# Patient Record
Sex: Female | Born: 1980 | Race: Black or African American | Hispanic: No | Smoking: Never smoker
Health system: Southern US, Community
[De-identification: ages and names within clinical notes are randomized; demographics above are authoritative.]

## PROBLEM LIST (undated history)

## (undated) DIAGNOSIS — Z872 Personal history of diseases of the skin and subcutaneous tissue: Secondary | ICD-10-CM

## (undated) DIAGNOSIS — N940 Mittelschmerz: Secondary | ICD-10-CM

## (undated) DIAGNOSIS — B977 Papillomavirus as the cause of diseases classified elsewhere: Secondary | ICD-10-CM

## (undated) DIAGNOSIS — K141 Geographic tongue: Secondary | ICD-10-CM

## (undated) DIAGNOSIS — D509 Iron deficiency anemia, unspecified: Secondary | ICD-10-CM

## (undated) DIAGNOSIS — J019 Acute sinusitis, unspecified: Secondary | ICD-10-CM

## (undated) DIAGNOSIS — M79609 Pain in unspecified limb: Secondary | ICD-10-CM

## (undated) DIAGNOSIS — L259 Unspecified contact dermatitis, unspecified cause: Secondary | ICD-10-CM

## (undated) HISTORY — DX: Acute sinusitis, unspecified: J01.90

## (undated) HISTORY — DX: Personal history of diseases of the skin and subcutaneous tissue: Z87.2

## (undated) HISTORY — DX: Unspecified contact dermatitis, unspecified cause: L25.9

## (undated) HISTORY — DX: Pain in unspecified limb: M79.609

## (undated) HISTORY — PX: COLPOSCOPY: SHX161

## (undated) HISTORY — DX: Mittelschmerz: N94.0

## (undated) HISTORY — DX: Iron deficiency anemia, unspecified: D50.9

## (undated) HISTORY — DX: Papillomavirus as the cause of diseases classified elsewhere: B97.7

## (undated) HISTORY — DX: Geographic tongue: K14.1

---
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
## 2001-06-04 ENCOUNTER — Ambulatory Visit (HOSPITAL_COMMUNITY): Admission: AD | Admit: 2001-06-04 | Discharge: 2001-06-04 | Payer: Self-pay | Admitting: *Deleted

## 2001-06-04 ENCOUNTER — Encounter: Payer: Self-pay | Admitting: *Deleted

## 2001-06-04 ENCOUNTER — Encounter (INDEPENDENT_AMBULATORY_CARE_PROVIDER_SITE_OTHER): Payer: Self-pay | Admitting: Specialist

## 2005-03-22 ENCOUNTER — Emergency Department (HOSPITAL_COMMUNITY): Admission: EM | Admit: 2005-03-22 | Discharge: 2005-03-22 | Payer: Self-pay | Admitting: Family Medicine

## 2005-08-17 ENCOUNTER — Emergency Department: Payer: Self-pay | Admitting: Emergency Medicine

## 2006-08-05 ENCOUNTER — Encounter: Payer: Self-pay | Admitting: Family Medicine

## 2006-08-05 LAB — CONVERTED CEMR LAB: Pap Smear: NORMAL

## 2007-01-11 ENCOUNTER — Ambulatory Visit: Payer: Self-pay | Admitting: Family Medicine

## 2007-01-31 ENCOUNTER — Emergency Department (HOSPITAL_COMMUNITY): Admission: EM | Admit: 2007-01-31 | Discharge: 2007-01-31 | Payer: Self-pay | Admitting: Family Medicine

## 2007-03-07 ENCOUNTER — Encounter (INDEPENDENT_AMBULATORY_CARE_PROVIDER_SITE_OTHER): Payer: Self-pay | Admitting: *Deleted

## 2007-03-11 ENCOUNTER — Ambulatory Visit: Payer: Self-pay | Admitting: Internal Medicine

## 2007-04-06 ENCOUNTER — Ambulatory Visit: Payer: Self-pay | Admitting: *Deleted

## 2007-04-06 ENCOUNTER — Other Ambulatory Visit: Admission: RE | Admit: 2007-04-06 | Discharge: 2007-04-06 | Payer: Self-pay | Admitting: Family Medicine

## 2007-04-06 ENCOUNTER — Encounter: Payer: Self-pay | Admitting: Family Medicine

## 2007-04-06 ENCOUNTER — Encounter: Payer: Self-pay | Admitting: Internal Medicine

## 2007-04-06 DIAGNOSIS — N940 Mittelschmerz: Secondary | ICD-10-CM

## 2007-04-11 LAB — CONVERTED CEMR LAB
Albumin: 3.5 g/dL (ref 3.5–5.2)
BUN: 8 mg/dL (ref 6–23)
Basophils Absolute: 0.2 10*3/uL — ABNORMAL HIGH (ref 0.0–0.1)
Basophils Relative: 3.3 % — ABNORMAL HIGH (ref 0.0–1.0)
CO2: 27 meq/L (ref 19–32)
Calcium: 9.1 mg/dL (ref 8.4–10.5)
Chloride: 106 meq/L (ref 96–112)
Creatinine, Ser: 0.8 mg/dL (ref 0.4–1.2)
Eosinophils Absolute: 0.1 10*3/uL (ref 0.0–0.6)
Eosinophils Relative: 0.9 % (ref 0.0–5.0)
GFR calc Af Amer: 112 mL/min
GFR calc non Af Amer: 92 mL/min
Glucose, Bld: 90 mg/dL (ref 70–99)
HCT: 33.7 % — ABNORMAL LOW (ref 36.0–46.0)
Hemoglobin: 11.2 g/dL — ABNORMAL LOW (ref 12.0–15.0)
Lymphocytes Relative: 39 % (ref 12.0–46.0)
MCHC: 33.3 g/dL (ref 30.0–36.0)
MCV: 78.3 fL (ref 78.0–100.0)
Monocytes Absolute: 0.2 10*3/uL (ref 0.2–0.7)
Monocytes Relative: 2.8 % — ABNORMAL LOW (ref 3.0–11.0)
Neutro Abs: 3 10*3/uL (ref 1.4–7.7)
Neutrophils Relative %: 54 % (ref 43.0–77.0)
Phosphorus: 4.4 mg/dL (ref 2.3–4.6)
Platelets: 256 10*3/uL (ref 150–400)
Potassium: 4.4 meq/L (ref 3.5–5.1)
RBC: 4.3 M/uL (ref 3.87–5.11)
RDW: 13.5 % (ref 11.5–14.6)
Sodium: 139 meq/L (ref 135–145)
WBC: 5.7 10*3/uL (ref 4.5–10.5)

## 2007-08-19 ENCOUNTER — Ambulatory Visit: Payer: Self-pay | Admitting: Internal Medicine

## 2007-09-12 ENCOUNTER — Encounter: Payer: Self-pay | Admitting: Family Medicine

## 2007-09-13 ENCOUNTER — Ambulatory Visit: Payer: Self-pay | Admitting: Family Medicine

## 2007-09-13 DIAGNOSIS — D509 Iron deficiency anemia, unspecified: Secondary | ICD-10-CM | POA: Insufficient documentation

## 2007-09-13 LAB — CONVERTED CEMR LAB
Ferritin: 11 ng/mL (ref 10.0–291.0)
Iron: 39 ug/dL — ABNORMAL LOW (ref 42–145)
Transferrin: 307 mg/dL (ref >=212.0)

## 2007-10-11 ENCOUNTER — Encounter: Payer: Self-pay | Admitting: Family Medicine

## 2007-10-15 IMAGING — CR DG ABDOMEN 3V
1 series · 4 of 4 positions shown · non-contrast
Comparison: none

REASON FOR EXAM: Abdominal pain, rm 15
COMMENTS:

[Series 1: view not recorded · 0.17mm/px · 4 of 4 slices shown]
[im 1/4]
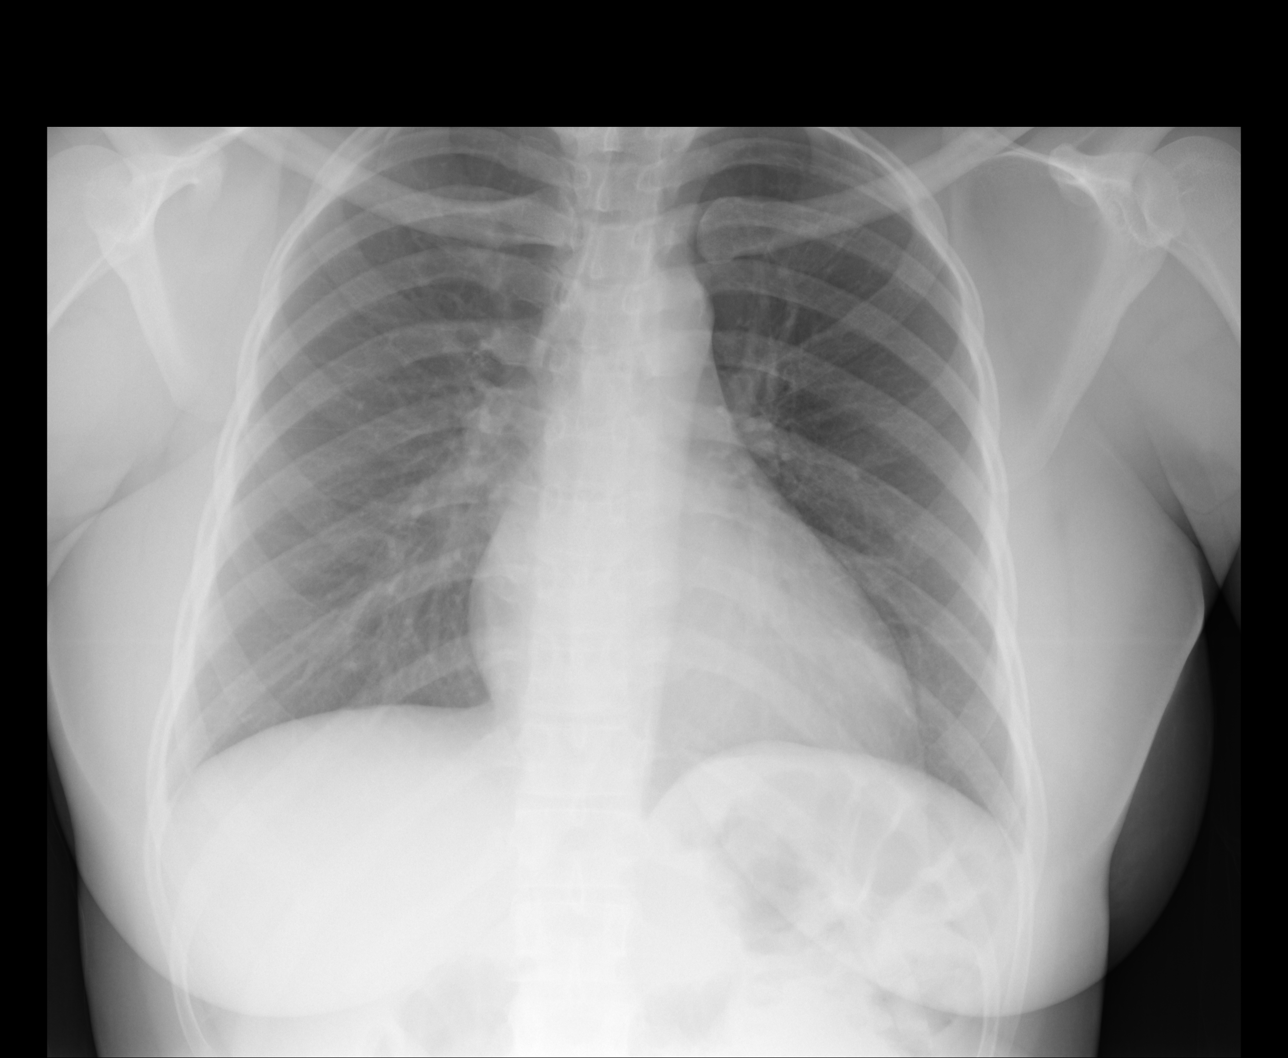
[im 2/4]
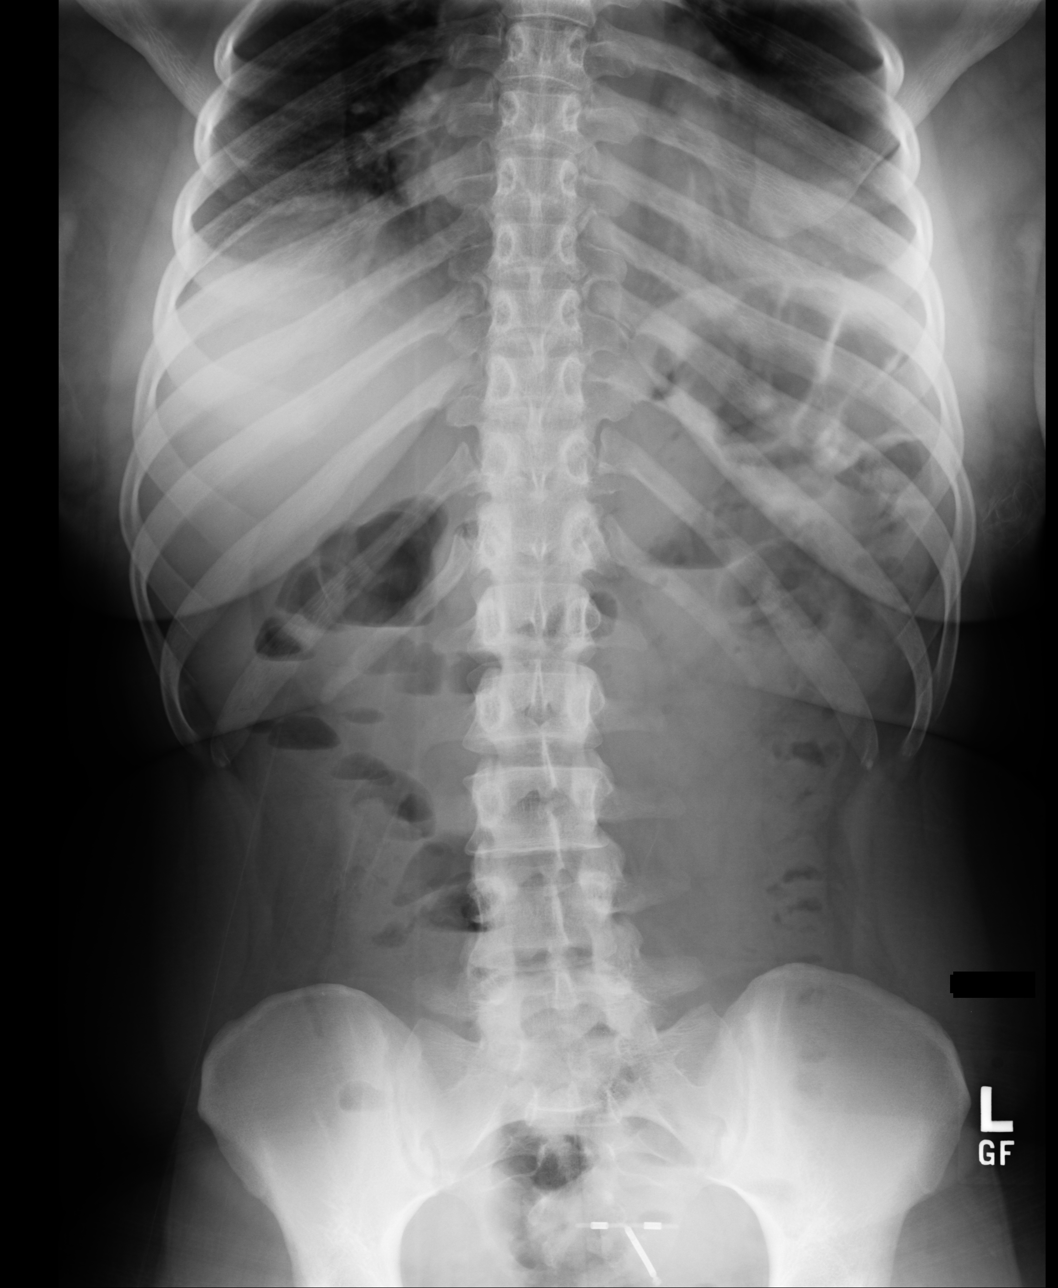
[im 3/4]
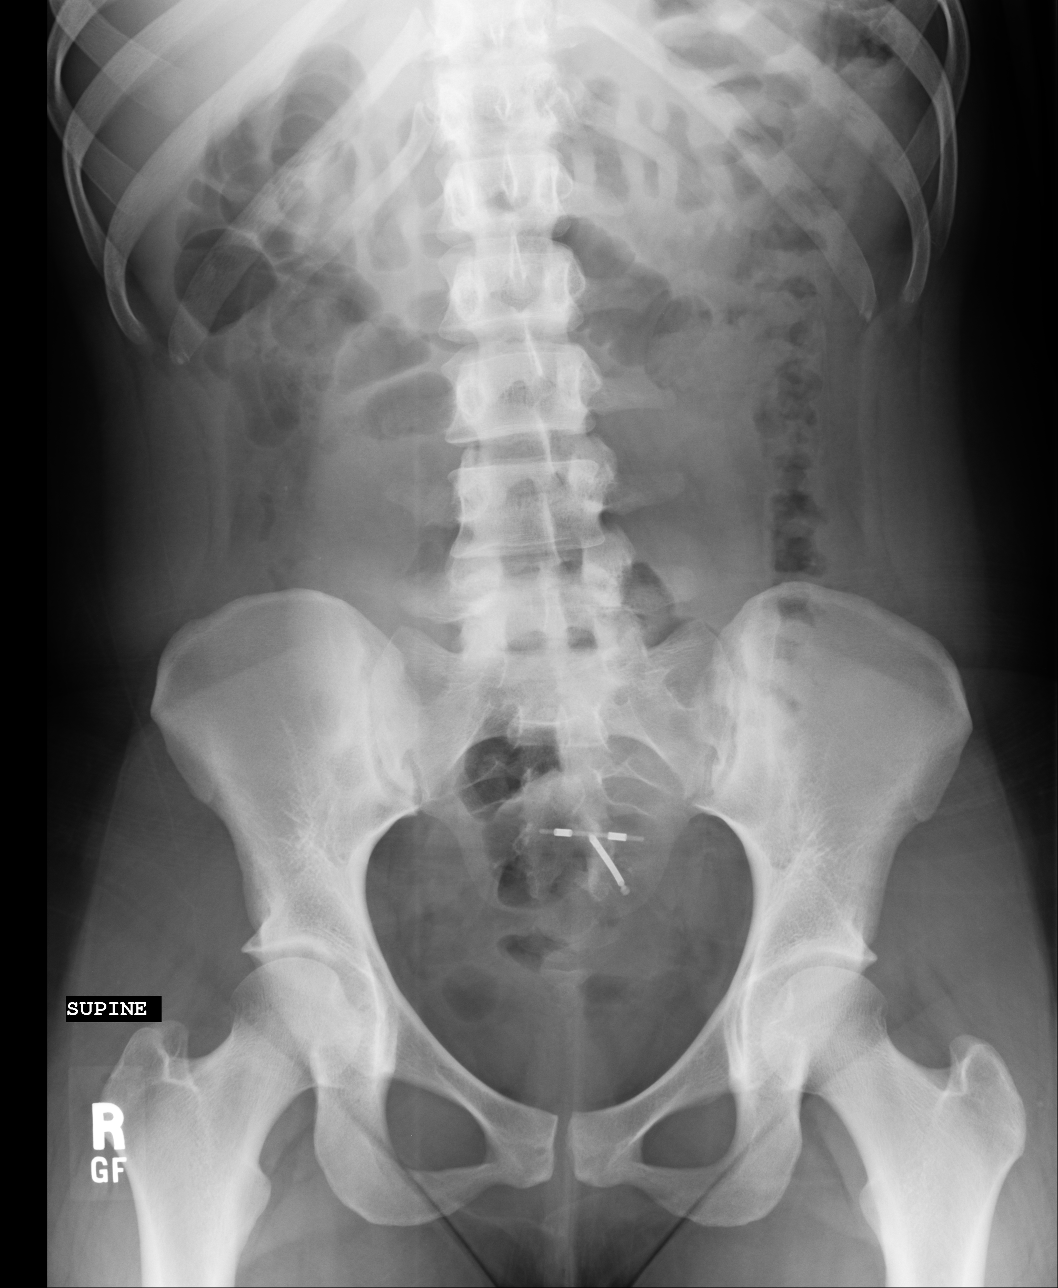
[im 4/4]
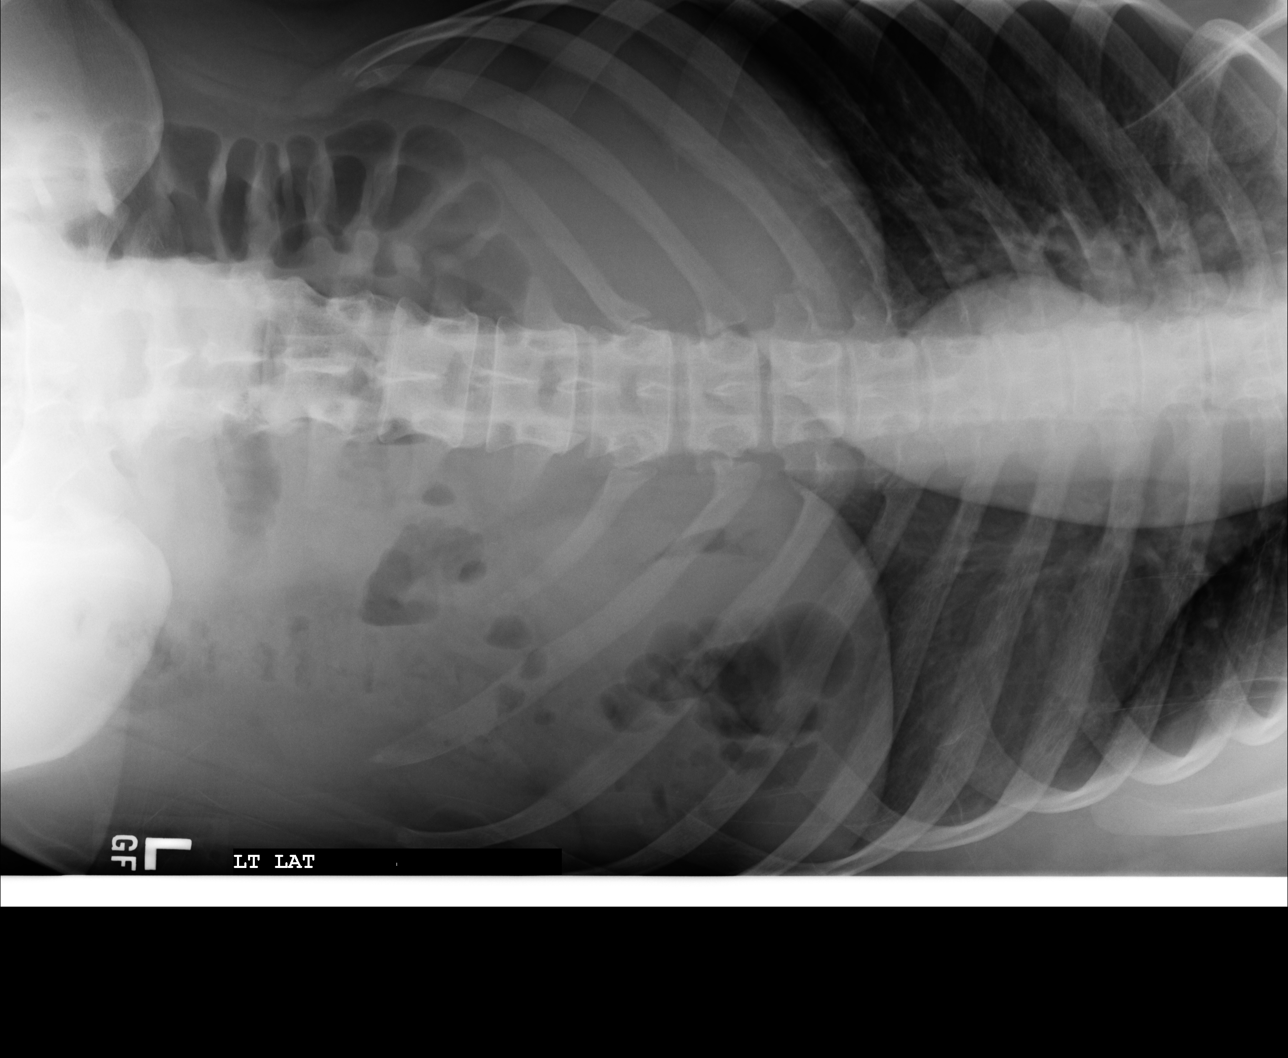

[4 of 4 positions shown; findings below may reference images not displayed]

PROCEDURE:     DXR - DXR ABDOMEN 3-WAY (INCL PA CXR)  - August 18, 2005  [DATE]

RESULT:     The chest x-ray reveals the lungs to be well expanded and clear.
 The heart and mediastinal structures are normal.  Views of the abdomen
reveal a relatively nonspecific bowel gas pattern.  There is gas in the
colon and rectum.  There are small amount of small bowel present as well.  A
few air-fluid levels are noted.  There is an IUD device in place.
IMPRESSION: I do not see evidence of bowel obstruction or perforation.  The gas pattern
is somewhat nonspecific.  If further imaging is desired, CT scanning would
be a useful next step.

## 2007-10-19 ENCOUNTER — Telehealth: Payer: Self-pay | Admitting: Family Medicine

## 2008-01-31 ENCOUNTER — Encounter: Payer: Self-pay | Admitting: Family Medicine

## 2008-01-31 DIAGNOSIS — Z872 Personal history of diseases of the skin and subcutaneous tissue: Secondary | ICD-10-CM | POA: Insufficient documentation

## 2008-02-01 ENCOUNTER — Ambulatory Visit: Payer: Self-pay | Admitting: Family Medicine

## 2008-02-01 LAB — CONVERTED CEMR LAB
Beta hcg, urine, semiquantitative: NEGATIVE
KOH Prep: 0

## 2008-03-26 ENCOUNTER — Ambulatory Visit: Payer: Self-pay | Admitting: Family Medicine

## 2008-06-05 ENCOUNTER — Ambulatory Visit: Payer: Self-pay | Admitting: Family Medicine

## 2009-08-16 ENCOUNTER — Ambulatory Visit: Payer: Self-pay | Admitting: Family Medicine

## 2009-08-16 ENCOUNTER — Other Ambulatory Visit: Admission: RE | Admit: 2009-08-16 | Discharge: 2009-08-16 | Payer: Self-pay | Admitting: Family Medicine

## 2009-08-16 ENCOUNTER — Encounter: Payer: Self-pay | Admitting: Family Medicine

## 2009-08-19 ENCOUNTER — Ambulatory Visit: Payer: Self-pay | Admitting: Family Medicine

## 2009-08-20 ENCOUNTER — Encounter (INDEPENDENT_AMBULATORY_CARE_PROVIDER_SITE_OTHER): Payer: Self-pay | Admitting: *Deleted

## 2009-08-20 LAB — CONVERTED CEMR LAB
ALT: 14 units/L (ref 0–35)
AST: 22 units/L (ref 0–37)
Albumin: 3.9 g/dL (ref 3.5–5.2)
Alkaline Phosphatase: 56 units/L (ref 39–117)
BUN: 6 mg/dL (ref 6–23)
Bilirubin, Direct: 0 mg/dL (ref 0.0–0.3)
CO2: 28 meq/L (ref 19–32)
Calcium: 8.9 mg/dL (ref 8.4–10.5)
Chloride: 104 meq/L (ref 96–112)
Cholesterol: 158 mg/dL (ref 0–200)
Creatinine, Ser: 0.9 mg/dL (ref 0.4–1.2)
GFR calc non Af Amer: 95.55 mL/min (ref >60)
Glucose, Bld: 83 mg/dL (ref 70–99)
HDL: 56.9 mg/dL (ref >39.00)
LDL Cholesterol: 93 mg/dL (ref 0–99)
Potassium: 3.6 meq/L (ref 3.5–5.1)
Sodium: 140 meq/L (ref 135–145)
Total Bilirubin: 0.8 mg/dL (ref 0.3–1.2)
Total CHOL/HDL Ratio: 3
Total Protein: 7.4 g/dL (ref 6.0–8.3)
Triglycerides: 40 mg/dL (ref 0.0–149.0)
VLDL: 8 mg/dL (ref 0.0–40.0)

## 2009-08-22 ENCOUNTER — Encounter (INDEPENDENT_AMBULATORY_CARE_PROVIDER_SITE_OTHER): Payer: Self-pay | Admitting: *Deleted

## 2009-08-22 LAB — CONVERTED CEMR LAB
HCV Ab: NEGATIVE
Hep A IgM: NEGATIVE
Hep B C IgM: NEGATIVE
Hepatitis B Surface Ag: NEGATIVE

## 2009-09-03 ENCOUNTER — Ambulatory Visit: Payer: Self-pay | Admitting: Family Medicine

## 2009-09-10 ENCOUNTER — Telehealth (INDEPENDENT_AMBULATORY_CARE_PROVIDER_SITE_OTHER): Payer: Self-pay | Admitting: *Deleted

## 2009-10-30 ENCOUNTER — Telehealth: Payer: Self-pay | Admitting: Family Medicine

## 2009-12-04 ENCOUNTER — Ambulatory Visit: Payer: Self-pay | Admitting: Family Medicine

## 2010-02-26 ENCOUNTER — Emergency Department (HOSPITAL_COMMUNITY): Admission: EM | Admit: 2010-02-26 | Discharge: 2010-02-26 | Payer: Self-pay | Admitting: Gastroenterology

## 2010-07-01 ENCOUNTER — Telehealth (INDEPENDENT_AMBULATORY_CARE_PROVIDER_SITE_OTHER): Payer: Self-pay | Admitting: *Deleted

## 2010-09-05 ENCOUNTER — Ambulatory Visit: Payer: Self-pay | Admitting: Family Medicine

## 2010-09-05 LAB — CONVERTED CEMR LAB
ALT: 12 units/L (ref 0–35)
AST: 18 units/L (ref 0–37)
Albumin: 3.7 g/dL (ref 3.5–5.2)
Alkaline Phosphatase: 50 units/L (ref 39–117)
BUN: 12 mg/dL (ref 6–23)
Bilirubin, Direct: 0.1 mg/dL (ref 0.0–0.3)
CO2: 30 meq/L (ref 19–32)
Calcium: 9 mg/dL (ref 8.4–10.5)
Chloride: 106 meq/L (ref 96–112)
Creatinine, Ser: 0.8 mg/dL (ref 0.4–1.2)
GFR calc non Af Amer: 102.71 mL/min (ref >60)
Glucose, Bld: 86 mg/dL (ref 70–99)
Potassium: 4.5 meq/L (ref 3.5–5.1)
Sodium: 142 meq/L (ref 135–145)
Total Bilirubin: 0.5 mg/dL (ref 0.3–1.2)
Total Protein: 6.7 g/dL (ref 6.0–8.3)

## 2010-09-09 ENCOUNTER — Ambulatory Visit: Payer: Self-pay | Admitting: Family Medicine

## 2010-09-09 ENCOUNTER — Other Ambulatory Visit
Admission: RE | Admit: 2010-09-09 | Discharge: 2010-09-09 | Payer: Self-pay | Source: Home / Self Care | Admitting: Family Medicine

## 2010-09-09 DIAGNOSIS — K219 Gastro-esophageal reflux disease without esophagitis: Secondary | ICD-10-CM

## 2010-09-09 LAB — CONVERTED CEMR LAB
HCV Ab: NEGATIVE
HIV: NONREACTIVE
Hep A IgM: NEGATIVE
Hep B C IgM: NEGATIVE
Hepatitis B Surface Ag: NEGATIVE

## 2010-09-09 LAB — HM PAP SMEAR

## 2010-09-11 LAB — CONVERTED CEMR LAB: Pap Smear: NEGATIVE

## 2010-09-12 ENCOUNTER — Encounter (INDEPENDENT_AMBULATORY_CARE_PROVIDER_SITE_OTHER): Payer: Self-pay | Admitting: *Deleted

## 2010-11-04 NOTE — Progress Notes (Signed)
----   Converted from flag ---- ---- 07/01/2010 9:22 AM, Kerby Nora MD wrote: Pt less than 40...no labs needed.  ---- 07/01/2010 8:02 AM, Liane Comber CMA (AAMA) wrote: Lab orders please! Good Morning! This pt is scheduled for cpx labs tomorrow, which labs to draw and dx codes to use? Thanks Tasha ------------------------------

## 2010-11-04 NOTE — Assessment & Plan Note (Signed)
Summary: ?VAGINAL INFECTION/CLE   Vital Signs:  Patient profile:   30 year old female Height:      65 inches Weight:      153.4 pounds BMI:     25.62 Temp:     98.2 degrees F oral Pulse rate:   80 / minute Pulse rhythm:   regular BP sitting:   110 / 70  (left arm) Cuff size:   regular  Vitals Entered By: Benny Lennert CMA Duncan Dull) (December 04, 2009 2:31 PM)   History of Present Illness: Chief complaint ? yeast infection and also ? sinus infection  In past 3 days  vaginal irritation.  Used NAir 4 days ago Always has discharge, no odor. Occ cramping on left abdomen. Has IUD in place.  Sexually active.   Starting this AM facial pain, headache, sore throat/itchy. Some runny nose Not taking any medications. never had allergies in past.    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 Take 1-2 Tabs As Needed For Menstrual Cramps. 2)  Paragard Intrauterine Copper   Iud (Iud's) 3)  Betamethasone Dipropionate 0.05 % Crea (Betamethasone Dipropionate) .... Apply Two Times A Day To Affected Area 4)  Terbinafine Hcl 250 Mg Tabs (Terbinafine Hcl) .Marland Kitchen.. 1 Tab By Mouth Daily  Allergies: 1)  ! Citric Acid PMH-FH-SH reviewed-no changes except otherwise noted  Review of Systems General:  Denies fatigue and fever. CV:  Denies chest pain or discomfort. Resp:  Denies shortness of breath. GI:  Denies abdominal pain. GU:  Denies dysuria.  Physical Exam  General:  Well-developed,well-nourished,in no  acute distress; alert,appropriate and cooperative throughout examination Head:  no maxillary sinus ttp Eyes:  No corneal or conjunctival inflammation noted. EOMI. Perrla. Funduscopic exam benign, without hemorrhages, exudates or papilledema. Vision grossly normal. Ears:  External ear exam shows no significant lesions or deformities.  Otoscopic examination reveals clear canals, tympanic membranes are intact bilaterally without bulging, retraction, inflammation or discharge. Hearing is grossly normal bilaterally. Nose:  nasal discharge and mucosal pallor.   Mouth:  MMM Neck:  no cervical or supraclavicular lymphadenopathy  Lungs:  Normal respiratory effort, chest expands symmetrically. Lungs are clear to auscultation, no crackles or wheezes. Heart:  Normal rate and regular rhythm. S1 and S2 normal without gallop, murmur, click, rub or other extra sounds. Genitalia:  normal introitus, no vaginal discharge, mucosa pink and moist, and no vaginal or cervical lesions.     Impression & Recommendations:  Problem # 1:  VULVOVAGINITIS (ICD-616.10) irritant. No infection seen. Stop Darene Lamer, no douche, wash with warm water. MAy use small amount OTC hydrocortisone cream short term for irritation.  The following medications were removed from the medication list:    Amoxicillin 500 Mg Caps (Amoxicillin) .Marland Kitchen... 2 tabs by mouth two times a day  Problem # 2:  URI (ICD-465.9) plus possible allergies. Treat with nasal saline, antihistamin and decongestant. Call if symptoms not improving in 7 days.  The following medications were removed from the medication list:    Tessalon  200 Mg Caps (Benzonatate) ..... One tab by mouth three times a day as needed cough Her updated medication list for this problem includes:    Naprosyn 500 Mg Tabs (Naproxen) .Marland Kitchen... Take 1-2 tabs as needed for menstrual cramps.  Complete Medication List: 1)  Naprosyn 500 Mg Tabs (Naproxen) .... Take 1-2 tabs as needed for menstrual cramps. 2)   Paragard Intrauterine Copper Iud (Iud's) 3)  Betamethasone Dipropionate 0.05 % Crea (Betamethasone dipropionate) .... Apply two times a day to affected area 4)  Terbinafine Hcl 250 Mg Tabs (Terbinafine hcl) .Marland Kitchen.. 1 tab by mouth daily  Patient Instructions: 1)  Zyretc, decongestant, ibuprofen for headache and nasal saline irrigation. Call if symptoms not improving in 7 days.   Current Allergies (reviewed today): ! CITRIC ACID

## 2010-11-04 NOTE — Assessment & Plan Note (Signed)
Summary: PAP SMEAR AND CPX/CLE   Vital Signs:  Patient profile:   30 year old female Height:      65 inches Weight:      152.4 pounds BMI:     25.45 Temp:     98.4 degrees F oral Pulse rate:   80 / minute Pulse rhythm:   regular BP sitting:   120 / 78  (left arm) Cuff size:   regular  Vitals Entered By: Benny Lennert CMA Duncan Dull) (September 09, 2010 2:12 PM)  History of Present Illness: Chief complaint pap smear and cpx The patient is here for annual wellness exam and preventative care.     She also has the following issue.   Notes in last few weeks..notes chest tightness after meals. No pain with exertion. No SOB. No sour taste inmouth or buring in throat. No abdominal pain.   Has IUD placed in 2002... wants to be reassured that this will not automatically stop working at that point. Interested in mirena IUD placement.   Reviewed labs in detail.. nml glucose on recheck (had abnormal when sick, drinking OJ)  [employer] instructor, exercsie regualrly. Poor eating..fast food lately.  Graduating from program school this week.  In [employer].   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 2008 or 2009 abnormal..Marland Kitchen2010 was here and was normal.  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 Take 1-2 Tabs As Needed For Menstrual Cramps. 2)  Paragard Intrauterine Copper   Iud (Iud's) 3)  Betamethasone Dipropionate 0.05 % Crea (Betamethasone  Dipropionate) .... Apply Two Times A Day To Affected Area 4)  Terbinafine Hcl 250 Mg Tabs (Terbinafine Hcl) .Marland Kitchen.. 1 Tab By Mouth Daily  Allergies: 1)  ! Citric Acid  Past History:  Past medical, surgical, family and social histories (including risk factors) reviewed, and no changes noted (except as noted below).  Past Medical History: Reviewed history from 01/31/2008 and no changes required. GEOGRAPHIC TONGUE (ICD-529.1) ECZEMA, HX OF (ICD-V13.3) HPV, HIGH RISK - CIN? (ICD-079.4) TOE PAIN (ICD-729.5) ANEMIA, IRON DEFICIENCY (ICD-280.9) DERMATITIS, FEET (ICD-692.9) SINUSITIS, ACUTE (ICD-461.9) MITTELSCHMERZ (ICD-625.2) HEALTH SCREENING (ICD-V70.0)    Past Surgical History: Reviewed history from 01/31/2008 and no changes required. 2001    Miscarriage 1007    Colposcopy - biopsy (-)  Family History: Reviewed history from 01/31/2008 and no changes required. Father: Alive 6, prostate CA Mother: Alive 7, HTN since 20's Siblings: 1 brother, healthy No MI < 65 CVA:  MGF DM: MGM ? Stomach CA:  PGM  Social History: Reviewed history from 01/31/2008 and no changes required. Never Smoked Alcohol use-yes, occasionally Drug use-no Regular exercise-yes, 2-3 days per week Marital Status: Single, relationship x 1 year, no protection Children: None Occupation: Leisure centre manager, Corporate investment banker in [employer] Diet:  Eating on the go, Fast food  Review of Systems General:  Denies fatigue and fever. Resp:  Denies cough, coughing up blood, and shortness of breath. GI:  Denies abdominal pain, bloody stools, constipation, and  diarrhea. GU:  Denies abnormal vaginal bleeding, discharge, and dysuria. Derm:  Flaky skin on right foot.. treated as fungal infection in past years..improving with current course of terbinafine. .  Physical Exam  General:  Well-developed,well-nourished,in no acute distress; alert,appropriate and cooperative throughout examination Eyes:  No corneal or conjunctival  inflammation noted. EOMI. Perrla. Funduscopic exam benign, without hemorrhages, exudates or papilledema. Vision grossly normal. Ears:  External ear exam shows no significant lesions or deformities.  Otoscopic examination reveals clear canals, tympanic membranes are intact bilaterally without bulging, retraction, inflammation or discharge. Hearing is grossly normal bilaterally. Nose:  External nasal examination shows no deformity or inflammation. Nasal mucosa are pink and moist without lesions or exudates. Mouth:  Oral mucosa and oropharynx without lesions or exudates.  Teeth in good repair. Neck:  no carotid bruit or thyromegaly no cervical or supraclavicular lymphadenopathy  Chest Wall:  No deformities, masses, or tenderness noted. Breasts:  No mass, nodules, thickening, tenderness, bulging, retraction, inflamation, nipple discharge or skin changes noted.   Lungs:  Normal respiratory effort, chest expands symmetrically. Lungs are clear to auscultation, no crackles or wheezes. Heart:  Normal rate and regular rhythm. S1 and S2 normal without gallop, murmur, click, rub or other extra sounds. Abdomen:  Bowel sounds positive,abdomen soft and non-tender without masses, organomegaly or hernias noted. Genitalia:  normal introitus, no vaginal discharge, mucosa pink and moist, and no vaginal or cervical lesions.    Very short IUD string seen in cervical os. Pulses:  R and L posterior tibial pulses are full and equal bilaterally  Extremities:  no edema Skin:  Intact without suspicious lesions or rashes Psych:  Cognition and judgment appear intact. Alert and cooperative with normal attention span and concentration. No apparent delusions, illusions, hallucinations   Impression & Recommendations:  Problem # 1:  HEALTH SCREENING (ICD-V70.0) The patient's preventative maintenance and recommended screening tests for an annual wellness exam were reviewed in full today. Brought up to date unless services  declined.  Counselled on the importance of diet, exercise, and its role in overall health and mortality. The patient's FH and SH was reviewed, including their home life, tobacco status, and drug and alcohol status.     Problem # 2:  ROUTINE GYNECOLOGICAL EXAMINATION (ICD-V72.31) HAS NOT had 3 nmls paps in a row... repeat this year and next...tilll 3 nml in a row then start spacing.  Problem # 3:  GERD (ICD-530.81)  Info giuven on dietary changes...start 4-6 week course of omeprazole 40 mg daily. Call if symptoms not improving in 2 weeks. If symptoms resolved, complete 4-6 week course then taper off medicaiton slowly.   Her updated medication list for this problem includes:    Omeprazole 40 Mg Cpdr (Omeprazole) .Marland Kitchen... 1 tab by mouth daily  Complete Medication List: 1)  Naprosyn 500 Mg Tabs (Naproxen) .... Take 1-2 tabs as needed for menstrual cramps. 2)  Paragard Intrauterine Copper Iud (Iud's) 3)  Betamethasone Dipropionate 0.05 % Crea (Betamethasone dipropionate) .... Apply two times a day to affected area 4)  Terbinafine Hcl 250 Mg Tabs (Terbinafine hcl) .Marland Kitchen.. 1 tab by mouth daily 5)  Omeprazole 40 Mg Cpdr (Omeprazole) .Marland Kitchen.. 1 tab by mouth daily  Other Orders: Admin 1st Vaccine (14782) Flu Vaccine 92yrs + (95621)  Patient Instructions: 1)  If interested in Mirena IUD placement...make an appt with Dr. Dayton Martes.  2)  Start with dietary changes (avoid tomato, citris, chocolate, soda, caffeiine, spicy foods, peppermint) 3)  ...start 4-6 week course of omeprazole 40 mg  daily. Call if symptoms not improving in 2 weeks. If symptoms resolved, complete 4-6 week course then taper off medicaiton slowly.  Prescriptions: OMEPRAZOLE 40 MG CPDR (OMEPRAZOLE) 1 tab by mouth daily  #30 x 2   Entered and Authorized by:   Kerby Nora MD   Signed by:   Kerby Nora MD on 09/09/2010   Method used:   [facility] to        [facility] (retail[facility]       [address]        [place], [place]  [address]       Ph: [phone] or [phone]       Fax: [phone]   RxID:   [id number]    Orders Added: 1)  Est. Patient 18-39 years [99395] 2)  Admin 1st Vaccine [90471] 3)  Flu Vaccine 65yrs + [phone]    Current Allergies (reviewed today): ! CITRIC ACID  Flu Vaccine Result Date:  09/09/2010 Flu Vaccine Result:  given Flu Vaccine Next Due:  1 yr                      Flu Vaccine Consent Questions     Do you have a history of severe allergic reactions to this vaccine? no    Any prior history of allergic reactions to egg and/or gelatin? no    Do you have a sensitivity to the preservative Thimersol? no    Do you have a past history of Guillan-Barre Syndrome? no    Do you currently have an acute febrile illness? no    Have you ever had a severe reaction to latex? no    Vaccine information given and explained to patient? yes    Are you currently pregnant? no    Lot Number:[id number]   Exp Date:04/04/2011   Site Given  Left Deltoid IM   .[email or web address]

## 2010-11-04 NOTE — Progress Notes (Signed)
Summary: Rx Terbinafine   Phone Note Call from Patient   Caller: Patient Call For: Kerby Nora MD Summary of Call: Patient called requesting BP and weight.  Called her back to clarify is she needed the values from her last visit or from the CPX.  Left message on voicemail for her to return call.   Linde Gillis CMA Duncan Dull)  October 30, 2009 1:15 PM   Spoke to patient and gave her values for her BP and weight at last visit.  She is also requesting a Rx for TERBINAFINE HCL 250 MG TABS (TERBINAFINE HCL) 1 tab by mouth daily.  Says Dr. Ermalene Searing prescribed this for her in the past but it was sent to the wrong pharmacy so she never picked it up.  Please advise.  [facility] Initial call taken by: Linde Gillis CMA Duncan Dull),  October 30, 2009 1:46 PM  Follow-up for Phone Call        PATIENT [id number] Follow-up by: Benny Lennert CMA Duncan Dull),  October 31, 2009 8:47 AM    Prescriptions: TERBINAFINE HCL 250 MG TABS (TERBINAFINE HCL) 1 tab by mouth daily  #30 x 1   Entered and Authorized by:   Kerby Nora MD   Signed by:   Kerby Nora MD on 10/30/2009   Method used:   [facility]        [facility] (retail)       [address]       [place], [place]  [address]       Ph: [phone] or [phone]       Fax: [phone]   RxID:   [id number]

## 2010-11-04 NOTE — Letter (Signed)
Summary: Results Follow up Letter  [place] [facility]  [address] [place], [place] [address]   Phone: [phone]  Fax: [phone]    09/12/2010 MRN: [id number]     Robin Leonard [address] [place], [place]  [address]    Dear Ms. Mccarter,  The following are the results of your recent test(s):  Test         Result    Pap Smear:        Normal __x___  Not Normal _____ Comments: ______________________________________________________ Cholesterol: LDL(Bad cholesterol):         Your goal is less than:         HDL (Good cholesterol):       Your goal is more than: Comments:  ______________________________________________________ Mammogram:        Normal _____  Not Normal _____ Comments:  ___________________________________________________________________ Hemoccult:        Normal _____  Not normal _______ Comments:    _____________________________________________________________________ Other Tests:All Std test negative    We routinely do not discuss normal results over the telephone.  If you desire a copy of the results, or you have any questions about this information we can discuss them at your next office visit.   Sincerely,  Kerby Nora MD

## 2010-12-01 ENCOUNTER — Encounter: Payer: Self-pay | Admitting: Family Medicine

## 2010-12-01 DIAGNOSIS — Z111 Encounter for screening for respiratory tuberculosis: Secondary | ICD-10-CM

## 2010-12-11 NOTE — Assessment & Plan Note (Signed)
  Nurse Visit   Allergies: 1)  ! Citric Acid  Immunizations Administered:  PPD Skin Test:    Vaccine Type: PPD    Site: left forearm    Mfr: Sanofi Pasteur    Dose: 0.1 ml    Route: ID    Given by: Benny Lennert CMA (AAMA)    Exp. Date: 06/05/2012    Lot #: [id number]  Orders Added: 1)  TB Skin Test [86580] 2)  Admin 1st Vaccine [phone]

## 2010-12-22 LAB — POCT RAPID STREP A (OFFICE): Streptococcus, Group A Screen (Direct): NEGATIVE

## 2011-02-20 NOTE — Assessment & Plan Note (Signed)
[facility]                           [place] OFFICE NOTE   CHYANE, GREER                          MRN:          [id number]  DATE:01/11/2007                            DOB:          [birth year]    CHIEF COMPLAINT:  Th[age]-year-old black female here to establish a  new doctor.   HISTORY OF PRESENT ILLNESS:  Ms. Higdon states that her previous doctor  insisted frequently that her blood pressure was high-normal and needed  to be treated with blood pressure medication.  She did not feel  comfortable with medication and was not compliant and would like to try  other forms of treatment such as lifestyle change.  She also states that  the blood pressure at the primary care doctor's was always elevated, but  normal at her gynecologist's as well as at pharmacy visits.   She also reports several days of a small rash on her right upper  abdomen; it is not itchy and has not changed in size.  She does not  remember any specific exposure.  She has not changed any medications,  lotions, etc.  She has not put any medication on it.   REVIEW OF SYSTEMS:  No headache.  No dizziness.  No shortness of breath.  No chest pain.  No palpitations.  No nausea, vomiting, diarrhea or  constipation.   PAST MEDICAL HISTORY:  1. High-risk HPV.  2. History of eczema.  3. Geographic tongue.   HOSPITALIZATIONS/SURGERIES/PROCEDURES:  1. Miscarriage, 2001.  2. Colposcopy, 2007, biopsy negative.  3. Pap smear, November 2007, negative.   ALLERGIES:  None.   MEDICATIONS:  Copper IUD.   SOCIAL HISTORY:  No tobacco use.  Occasional alcohol use on Friday and  Saturday night, about 2-3 drinks.  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  She is single, but has been in a relationship for a  year.  She does not use any current protection such as condoms.  She has  no children.  She exercises 3 days per week.  She eats a lot of fast  food and eats on the  run and does not believes she eats very  healthfully.   FAMILY HISTORY:  Father alive at age 62 with prostate cancer.  Mother  alive at age 91 with hypertension, which she developed at age 47.  No MI  in the family before age 77.  She has 1 brother who is healthy.  She has  a maternal grandfather with stroke and maternal grandmother with  diabetes, paternal grandfather with possible stomach cancer.   PHYSICAL EXAM:  Height 65 inches, weight 160.6.  Blood pressure 122/90,  pulse 68, temperature 98.3.  GENERAL:  Healthy-appearing overweight female in no apparent distress.  HEENT:  PERRLA.  Extraocular muscles intact.  Oropharynx clear.  TMs  clear.  Nares clear.  No thyromegaly.  No lymphadenopathy, supraclavicular or cervical.  SKIN:  Faint, slightly erythematous, raised rash approximately 2 cm in  diameter on right upper abdomen, no flake, no central clearing.  CARDIOVASCULAR:  Regular rate  and rhythm.  No murmurs, rubs, or gallops.  PULMONARY:  Clear to auscultation bilaterally.  No wheezes, rales or  rhonchi.  ABDOMEN:  Soft and nontender.  Normoactive bowel sounds.  No  hepatosplenomegaly.  MUSCULOSKELETAL:  Strength 5/5 in upper extremities.  NEUROLOGIC:  Cranial nerves II-XII grossly intact.  Patellar reflexes 2+  bilaterally.  Sensation intact in upper and lower extremities.   ASSESSMENT AND PLAN:  1. Elevated blood pressure measurements:  Her diastolic blood pressure      is slightly elevated today.  I recommended to have her follow her      blood pressures and record them at home.  She will let me know if 3      measurements are above 140/90.  In the meantime, I encouraged her      to work on weight loss and exercise and she was given information      about low-salt diet.  She does have a strong family history for      hypertension and is at risk to develop this, whether or not her      blood pressure is elevated at this point in time.  She will follow      up with blood  pressure in 3 months.  2. Rash:  This appears most likely to be secondary to nummular eczema      or dry skin.  It does not appear consistent with a fungal infection      at this point in time.  She will apply hydrocortisone over the      counter 1% for 2 weeks b.i.d.; if there is no improvement, she will      let me know.  3. Prevention:  She is up to date with Pap smear.  She is up to date      with vaccines.  I will obtain records from her previous doctor to      determine if she has had a baseline cholesterol panel evaluated,      which it sounds like she has.  She was encouraged to improve her      eating habits and was given information on eating on the run.     Kerby Nora, MD  Electronically Signed    AB/MedQ  DD: 01/12/2007  DT: 01/13/2007  Job #: [id number]

## 2011-02-20 NOTE — Op Note (Signed)
[facility] of [facility]  Patient:    Robin Leonard, Robin Leonard Visit Number: [id number] MRN: [id number]          Service Type: Attending:  Caralyn Guile. Arlyce Dice, M.D. Dictated by:   Caralyn Guile Arlyce Dice, M.D. Proc. Date: 06/04/01                             Operative Report  PREOPERATIVE DIAGNOSIS:       Incomplete abortion.  POSTOPERATIVE DIAGNOSIS:      Incomplete abortion.  PROCEDURE:                    Dilatation and evacuation.  SURGEON:                      Richard D. Arlyce Dice, M.D.  ANESTHESIA:                   Paracervical block with IV sedation.  ESTIMATED BLOOD LOSS:         50 cc.  FINDINGS:                     A fair amount of blood and tissue in the external os, which was widely dilated.  On D&E, a small amount of retained tissue was obtained.  INDICATIONS:                  This is a 30 year old gravida 2, AB1 who presented to the emergency room with severe cramping and moderate bleeding. Ultrasound revealed a small gestational sac, deformed and at the external os. Because the gestation appeared to be early and the sac appeared to be disrupted, an attempt was made to create a compete abortion using Cytotec.  At 9:00 a.m., the patient was given 600 mcg of Cytotec per vagina and Toradol and morphine for pain.  The patient was observed for four hours in hopes that she would pass products of conception, her bleeding would slow down and her cramping would stop.  However, she continued to bleed and cramp and the decision was made to do a dilatation and evacuation.  DESCRIPTION OF PROCEDURE:     The patient was taken to the operating room and placed in the supine position.  IV sedation was administered. S he was placed in the dorsal lithotomy position and 20 cc of 1% lidocaine was placed in the paracervical tissues.  A #8 suction curet was introduced into the already dilated cervix into the endometrial cavity and the endometrium was systematically curettage until no  tissue was left.  Prior to this, a large amount of blood and clot was removed from the exocervix and the vagina.  This specimen, along with the suction specimen, was sent as one to pathology for pathologic confirmation of products of conception.  The procedure was then terminated.  The patient left the operating room in good condition. Dictated by:   Caralyn Guile Arlyce Dice, M.D. Attending:  Caralyn Guile. Arlyce Dice, M.D. DD:  06/04/01 TD:  06/04/01 Job: [id number]

## 2012-04-14 ENCOUNTER — Encounter: Payer: Self-pay | Admitting: Family Medicine

## 2012-04-14 ENCOUNTER — Ambulatory Visit (INDEPENDENT_AMBULATORY_CARE_PROVIDER_SITE_OTHER): Payer: Managed Care, Other (non HMO) | Admitting: Family Medicine

## 2012-04-14 VITALS — BP 120/80 | HR 65 | Temp 98.3°F | Ht 65.0 in | Wt 163.2 lb

## 2012-04-14 DIAGNOSIS — R109 Unspecified abdominal pain: Secondary | ICD-10-CM

## 2012-04-14 DIAGNOSIS — A499 Bacterial infection, unspecified: Secondary | ICD-10-CM

## 2012-04-14 DIAGNOSIS — B9689 Other specified bacterial agents as the cause of diseases classified elsewhere: Secondary | ICD-10-CM

## 2012-04-14 DIAGNOSIS — N760 Acute vaginitis: Secondary | ICD-10-CM

## 2012-04-14 LAB — POCT URINALYSIS DIPSTICK
Bilirubin, UA: NEGATIVE
Glucose, UA: NEGATIVE
Leukocytes, UA: NEGATIVE
Nitrite, UA: NEGATIVE
Urobilinogen, UA: NEGATIVE

## 2012-04-14 MED ORDER — METRONIDAZOLE 500 MG PO TABS: 500.0000 mg | ORAL_TABLET | Freq: Two times a day (BID) | ORAL | Status: AC

## 2012-04-14 NOTE — Progress Notes (Signed)
   [employer] conservation officer at [facility] [address] [place] [address] Phone: [phone] Fax: [phone]  Date:  04/14/2012   Name:  Robin Leonard   DOB:  08-Aug-1981   MRN:  [id number]  PCP:  Kerby Nora, MD    Chief Complaint: vaginitis   History of Present Illness:  Robin Leonard  is a 31 y.o. very pleasant female patient who presents with the following:  Patient presents as increased vaginal discharge. No burning or itching exteriorly. No dysuria. No increased or risk of sexual activity or std  Past Medical History, Surgical History, Social History, Family History, Problem List, Medications, and Allergies have been reviewed and updated if relevant.  No current outpatient prescriptions on file prior to visit.    Review of Systems:  GEN: No acute illnesses, no fevers, chills. GI: No n/v/d, eating normally Pulm: No SOB Interactive and getting along well at home.  Otherwise, ROS is as per the HPI.   Physical Examination: Filed Vitals:   04/14/12 1051  BP: 120/80  Pulse: 65  Temp: 98.3 F (36.8 C)   Filed Vitals:   04/14/12 1051  Height: 5\' 5"  (1.651 m)  Weight: 163 lb 4 oz (74.05 kg)   Body mass index is 27.17 kg/(m^2). Ideal Body Weight: Weight in (lb) to have BMI = 25: 149.9    GEN: WDWN, NAD, Non-toxic, Alert & Oriented x 3 HEENT: Atraumatic, Normocephalic.  Ears and Nose: No external deformity. EXTR: No clubbing/cyanosis/edema NEURO: Normal gait.  PSYCH: Normally interactive. Conversant. Not depressed or anxious appearing.  Calm demeanor.  GU: Normal external genitalia. There is some discharge.  EKG / Xrays / Labs: None available at the time of encounter.  Assessment and Plan:  1. Bacterial vaginal infection    2. Abdominal pain  POCT Urinalysis Dipstick   Wet prep shows some abundant clue cells. No trichomonas. No yeast.  Orders Today: Orders Placed This Encounter  Procedures  . HM PAP SMEAR    This external order was created through  the Results Console.  Marland Kitchen POCT Urinalysis Dipstick    Medications Today: Meds ordered this encounter  Medications  . DISCONTD: levonorgestrel (MIRENA) 20 MCG/24HR IUD    Sig: 1 each by Intrauterine route once.  . metroNIDAZOLE (FLAGYL) 500 MG tablet    Sig: Take 1 tablet (500 mg total) by mouth 2 (two) times daily.    Dispense:  14 tablet    Refill:  0     Hannah Beat, MD

## 2012-05-12 ENCOUNTER — Telehealth: Payer: Self-pay | Admitting: Family Medicine

## 2012-05-12 NOTE — Telephone Encounter (Signed)
Caller: Neliah/Patient; PCP: Kerby Nora E.; CB#: [phone];  Call regarding Vaginal Yeast Infection; Notes vaginal itching and irritation, no discharge.  Onset: 05/10/12.  Afebrile.  LMP; appro7/28/13   IUD.  Completed antibiotics for bacterial vaginoisis approximately 05/05/12.  Used Monostat 1 without much improvement.   Advised to see MD within 24 hours for genital itching, burning or redness per Vaginal Discharge or Irritation Guideline.    Declined another appointment d/t travels for work and just seen 04/14/12.  States "always" gets a yeast infection after treatment with antibiotics; MD said to call for treamtent if symptoms occurred.  [facility] [address] [place]ice Lim [phone]. Please call patient if RX not called in.

## 2012-05-12 NOTE — Telephone Encounter (Signed)
i am ok sending in diflucan 150, 1 po x 1, repeat in 1 week if symptoms persist, #2, 0 ref  F/u if not better from this

## 2012-05-12 NOTE — Telephone Encounter (Signed)
Agreed -

## 2012-05-13 ENCOUNTER — Ambulatory Visit (INDEPENDENT_AMBULATORY_CARE_PROVIDER_SITE_OTHER): Payer: Managed Care, Other (non HMO) | Admitting: Family Medicine

## 2012-05-13 ENCOUNTER — Encounter: Payer: Self-pay | Admitting: Family Medicine

## 2012-05-13 ENCOUNTER — Ambulatory Visit: Payer: Managed Care, Other (non HMO) | Admitting: Family Medicine

## 2012-05-13 VITALS — BP 120/68 | HR 92 | Temp 98.2°F | Ht 65.0 in | Wt 160.8 lb

## 2012-05-13 DIAGNOSIS — Z975 Presence of (intrauterine) contraceptive device: Secondary | ICD-10-CM

## 2012-05-13 DIAGNOSIS — IMO0001 Reserved for inherently not codable concepts without codable children: Secondary | ICD-10-CM

## 2012-05-13 DIAGNOSIS — N760 Acute vaginitis: Secondary | ICD-10-CM

## 2012-05-13 LAB — POCT URINALYSIS DIPSTICK
Glucose, UA: NEGATIVE
Nitrite, UA: NEGATIVE
pH, UA: 6

## 2012-05-13 MED ORDER — FLUCONAZOLE 150 MG PO TABS: ORAL_TABLET | ORAL | Status: AC

## 2012-05-13 NOTE — Assessment & Plan Note (Signed)
Yeast vaginitis following tx with flagyl  tx with diflucan 150 mg times 2 since she does not resp to otc tx Disc ways to prevent vaginitis

## 2012-05-13 NOTE — Telephone Encounter (Signed)
Patient wanted to come in and see a doctor instead of just having something called in. Appt made with tower at 10

## 2012-05-13 NOTE — Patient Instructions (Addendum)
Take the diflucan for yeast as directed Update if not improved in a week  We will do gyn referral at check out

## 2012-05-13 NOTE — Assessment & Plan Note (Signed)
Will ref to gyn for annual exam and to have iud removed

## 2012-05-13 NOTE — Progress Notes (Signed)
Subjective:    Patient ID: Robin Leonard, female    DOB: Jul 04, 1981, 31 y.o.   MRN: [id number]  HPI Here for vaginitis  Was here for bacterial vaginosis - and was treated with metronizadole for 7 days  Yeast symptoms started on Tuesday Itchy , no discharge  More inside than outside  Did 1 day monistat 2 d ago -- usually does not work well for her  No difference yet   No worries about STDS  Needs referral as well for gyn to have her IUD removed  Would like to go to [facility] [facility] Also needs annual exam and pap smear    Patient Active Problem List  Diagnosis  . ANEMIA, IRON DEFICIENCY  . GERD  . MITTELSCHMERZ  . ECZEMA, HX OF   Past Medical History  Diagnosis Date  . Geographic tongue   . Personal history of diseases of skin and subcutaneous tissue   . 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 Pain in limb   . Iron deficiency anemia, unspecified   . Contact dermatitis and other eczema, due to unspecified cause   . Acute sinusitis, unspecified   . Mittelschmerz    Past Surgical History  Procedure Date  . Colposcopy     2007   History  Substance Use Topics  . Smoking status: Never Smoker   . Smokeless tobacco: Not on file  . Alcohol Use: Yes   Family History  Problem Relation Age of Onset  . Hypertension Mother   . Cancer Father     prostate  . Diabetes Maternal Grandmother   . Stroke Maternal Grandfather   . Cancer Paternal Grandmother     stomach   Allergies  Allergen Reactions  . Citric Acid     REACTION: Patient is allergic to this medication.   No current outpatient prescriptions on file prior to visit.        Review of Systems Review of Systems  Constitutional: Negative for fever, appetite change, fatigue and unexpected weight change.  Eyes: Negative for pain and visual disturbance.  Respiratory: Negative for cough and shortness of breath.   Cardiovascular: Negative for cp or palpitations      Gastrointestinal: Negative for nausea, diarrhea and constipation.  Genitourinary: Negative for urgency and frequency. pos for some vaginal spotting from her IUD (is time to get that out and she needs a referral) Skin: Negative for pallor or rash   Neurological: Negative for weakness, light-headedness, numbness and headaches.  Hematological: Negative for adenopathy. Does not bruise/bleed easily.  Psychiatric/Behavioral: Negative for dysphoric mood. The patient is not nervous/anxious.         Objective:   Physical Exam  Constitutional: She appears well-developed and well-nourished. No distress.  HENT:  Head: Normocephalic.  Eyes: Conjunctivae and EOM are normal. Pupils are equal, round, and reactive to light. Right eye exhibits no discharge. Left eye exhibits no discharge.  Neck: Normal range of motion. Neck supple.  Cardiovascular: Normal rate and regular rhythm.   Pulmonary/Chest: Effort normal and breath sounds normal.  Abdominal: Soft. Bowel sounds are normal.       No suprapubic tenderness    Genitourinary: There is no rash, tenderness or lesion on the right labia. There is no rash, tenderness or lesion on the left labia. No erythema, tenderness or bleeding around the vagina. No vaginal discharge found.  Lymphadenopathy:    She has no cervical adenopathy.  Neurological: She is alert.  Skin: Skin  is warm and dry. No rash noted.  Psychiatric: She has a normal mood and affect.          Assessment & Plan:

## 2012-05-15 LAB — POCT WET PREP (WET MOUNT): Trichomonas Wet Prep HPF POC: NEGATIVE

## 2014-11-26 ENCOUNTER — Other Ambulatory Visit: Payer: Self-pay | Admitting: Family Medicine

## 2014-11-26 NOTE — Telephone Encounter (Signed)
Needs appt to be seen to prescribe this med.

## 2014-11-26 NOTE — Telephone Encounter (Signed)
Patient was last seen on and prescribed this rx on 05/13/12. Dr Milinda Antisower saw her on that OV and prescribe her this medication. Please advised.
